# Patient Record
Sex: Male | Born: 1995 | Race: White | Hispanic: No | Marital: Married | Smoking: Never smoker
Health system: Southern US, Community
[De-identification: ages and names within clinical notes are randomized; demographics above are authoritative.]

---
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
## 2015-04-15 ENCOUNTER — Encounter: Payer: Self-pay | Admitting: Sports Medicine

## 2015-04-15 ENCOUNTER — Ambulatory Visit (INDEPENDENT_AMBULATORY_CARE_PROVIDER_SITE_OTHER): Payer: 59 | Admitting: Sports Medicine

## 2015-04-15 DIAGNOSIS — M5416 Radiculopathy, lumbar region: Secondary | ICD-10-CM

## 2015-04-15 MED ORDER — PREGABALIN 50 MG PO CAPS: 50.0000 mg | ORAL_CAPSULE | Freq: Three times a day (TID) | ORAL | Status: AC

## 2015-04-15 NOTE — Assessment & Plan Note (Signed)
Persistent pain, with insufficient response after S1 selective nerve root blocks. Repeat left S1 epidural. Referral to Dr. Yevette Edwardsumonski for consideration of microdiscectomy. Adding Lyrica 50 mg 3 times a day.

## 2015-04-15 NOTE — Progress Notes (Signed)
  Subjective:    CC: Follow-up  HPI: Jordan Espinoza has a fairly large left-sided L5-S1 disc protrusion with material appearing to track down the left S1 nerve root, he did well with physical therapy and subsequently a selective left-sided S1 epidural, on further questioning he tells me he never had complete release of symptoms, and continues to have some mild discogenic axial back pain, his pain however is predominantly in the buttock, posterior thigh, and radicular down the left leg in an exact S1 distribution. He denies any bowel or bladder dysfunction or saddle numbness, no constitutional symptoms. He is looking for a more permanent solution at this time.  Past medical history, Surgical history, Family history not pertinant except as noted below, Social history, Allergies, and medications have been entered into the medical record, reviewed, and no changes needed.   Review of Systems: No fevers, chills, night sweats, weight loss, chest pain, or shortness of breath.   Objective:    General: Well Developed, well nourished, and in no acute distress.  Neuro: Alert and oriented x3, extra-ocular muscles intact, sensation grossly intact.  HEENT: Normocephalic, atraumatic, pupils equal round reactive to light, neck supple, no masses, no lymphadenopathy, thyroid nonpalpable.  Skin: Warm and dry, no rashes. Cardiac: Regular rate and rhythm, no murmurs rubs or gallops, no lower extremity edema.  Respiratory: Clear to auscultation bilaterally. Not using accessory muscles, speaking in full sentences.  Impression and Recommendations:

## 2015-04-23 ENCOUNTER — Ambulatory Visit
Admission: RE | Admit: 2015-04-23 | Discharge: 2015-04-23 | Disposition: A | Discharge status: Home / Self Care | Payer: Self-pay | Source: Ambulatory Visit | Attending: Sports Medicine | Admitting: Sports Medicine

## 2015-04-23 VITALS — BP 128/80 | HR 70

## 2015-04-23 DIAGNOSIS — M5416 Radiculopathy, lumbar region: Secondary | ICD-10-CM

## 2015-04-23 MED ORDER — METHYLPREDNISOLONE ACETATE 40 MG/ML INJ SUSP (RADIOLOG
120.0000 mg | Freq: Once | INTRAMUSCULAR | Status: AC
  Administered 2015-04-23: 120 mg via EPIDURAL

## 2015-04-23 MED ORDER — IOHEXOL 180 MG/ML  SOLN
1.0000 mL | Freq: Once | INTRAMUSCULAR | Status: AC | PRN
  Administered 2015-04-23: 1 mL via EPIDURAL

## 2015-04-23 NOTE — Discharge Instructions (Signed)

## 2015-10-01 IMAGING — CT CT NECK W/ CM
3 of 5 series · 13 of 33 positions shown, 16 images · IV contrast (omnipaque)
Comparison: None available.

CLINICAL DATA: Lymph adenitis. Palpable nodule on the left side of
the neck which has been increasing in size despite antibiotics.

EXAM:
CT NECK WITH CONTRAST
TECHNIQUE: Multidetector CT imaging of the neck was performed using the
standard protocol following the bolus administration of intravenous
contrast.
CONTRAST:  75mL OMNIPAQUE IOHEXOL 300 MG/ML  SOLN

[Series 4: neck 2.0 coronal · coronal · 0.56mm/px · 3 of 107 slices shown]
[im 31/107  bone]
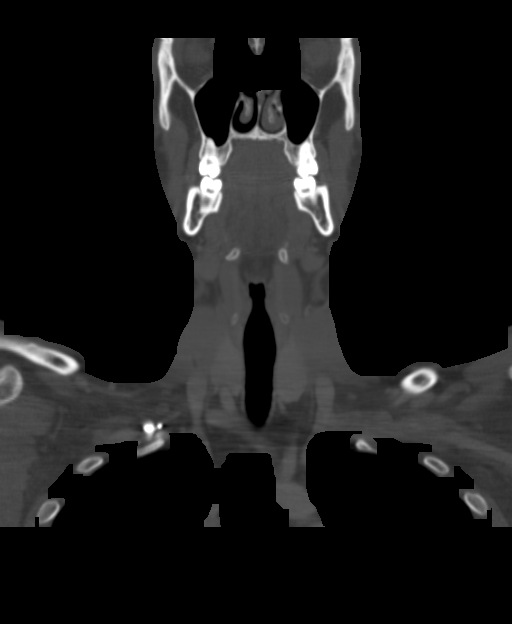
[im 46/107  bone]
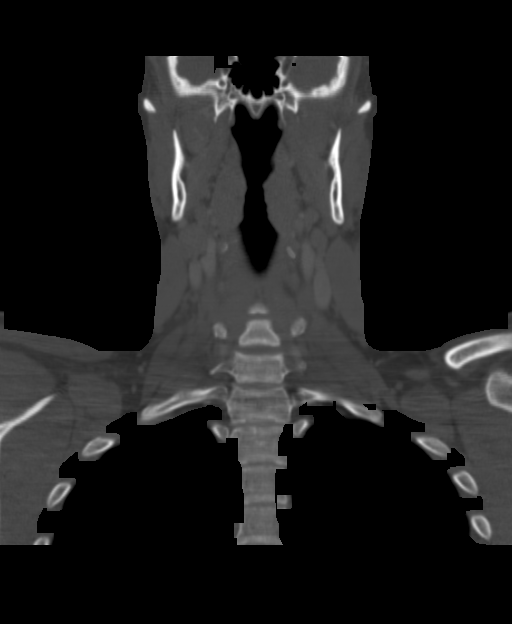
[im 61/107  bone]
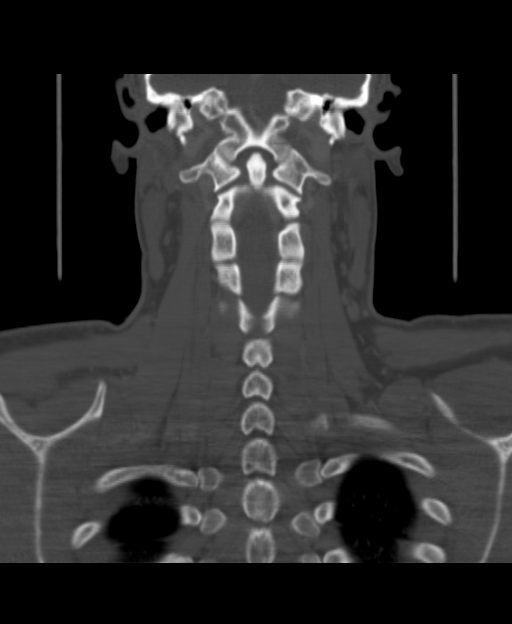

[Series 5: neck 2.0 sagittal · sagittal · 0.56mm/px · 5 of 128 slices shown, 6 images]
[im 43/128  bone]
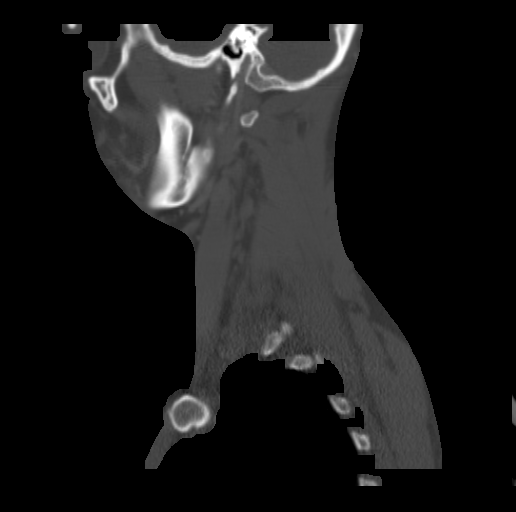
[im 53/128  bone]
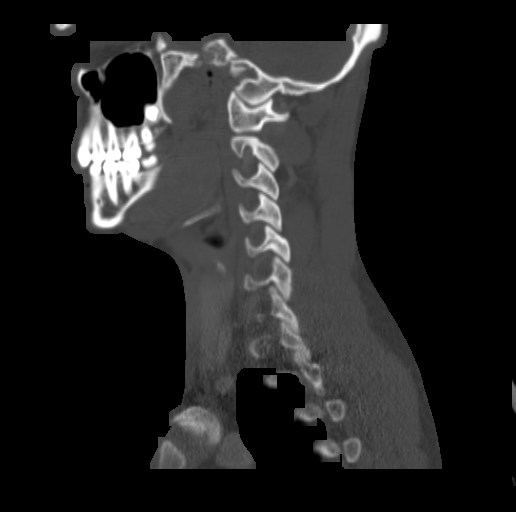
[im 64/128  soft-tissue]
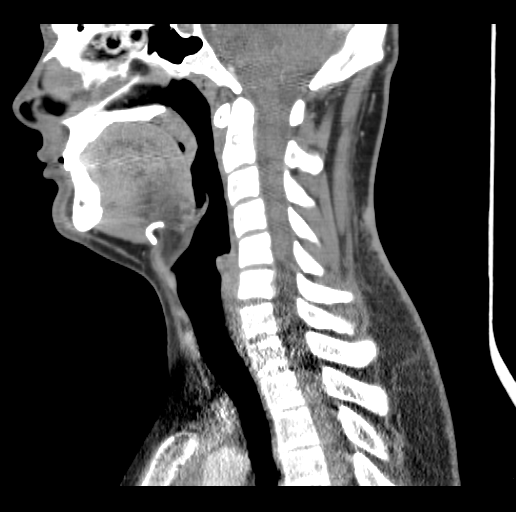
[im 64/128  bone]
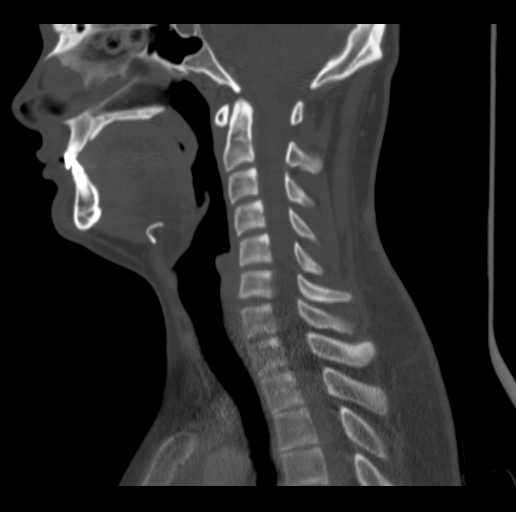
[im 75/128  bone]
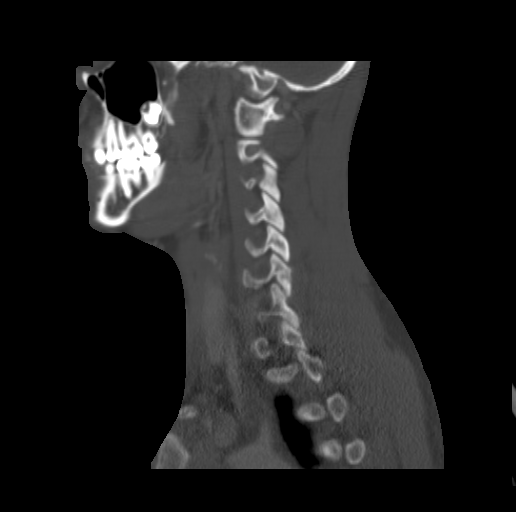
[im 85/128  bone]
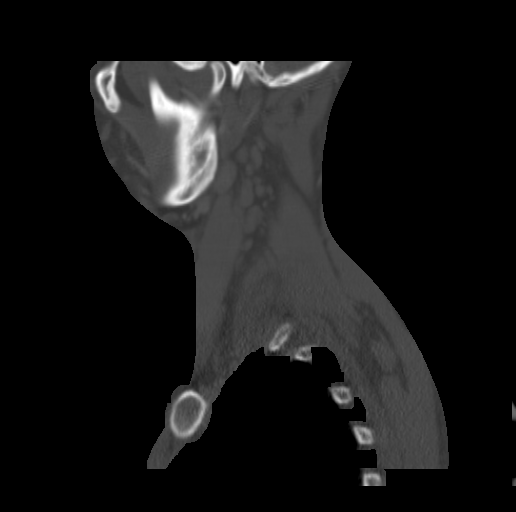

[Series 7: neck 2.0 orth axial to hyoid · axial · 0.38mm/px · z∈[+1013,+1205]mm · 5 of 148 slices shown, 7 images]
[im 25/148  soft-tissue]
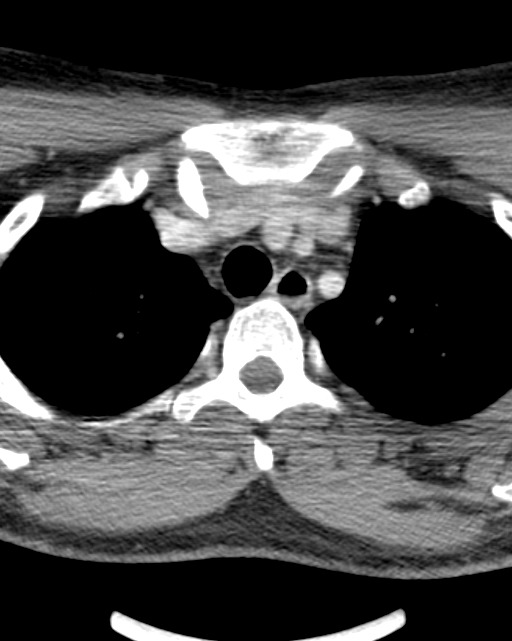
[im 25/148  bone]
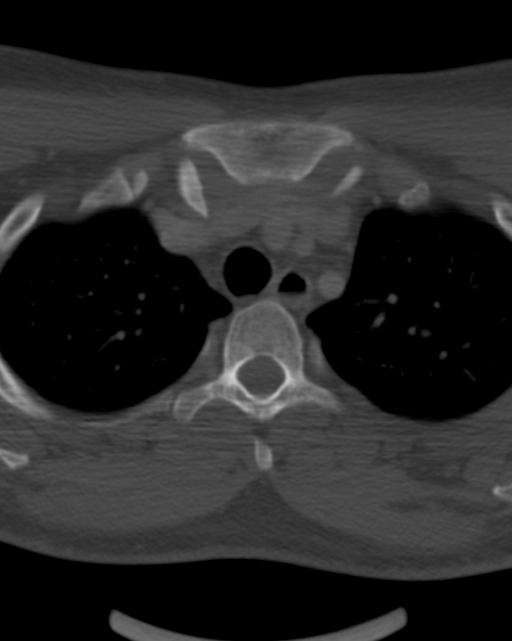
[im 50/148  bone]
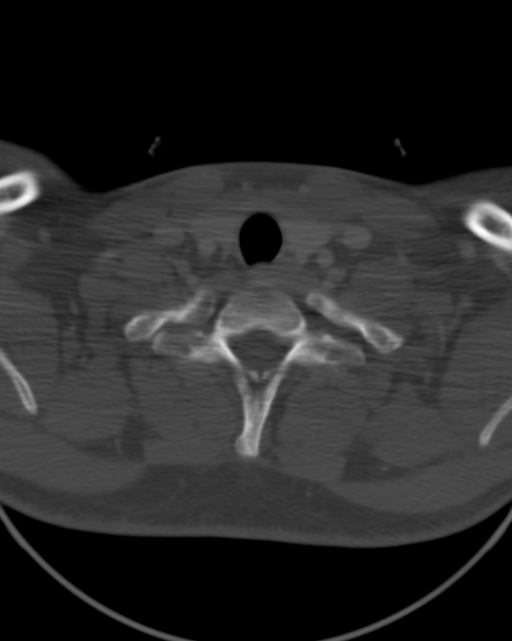
[im 74/148  bone]
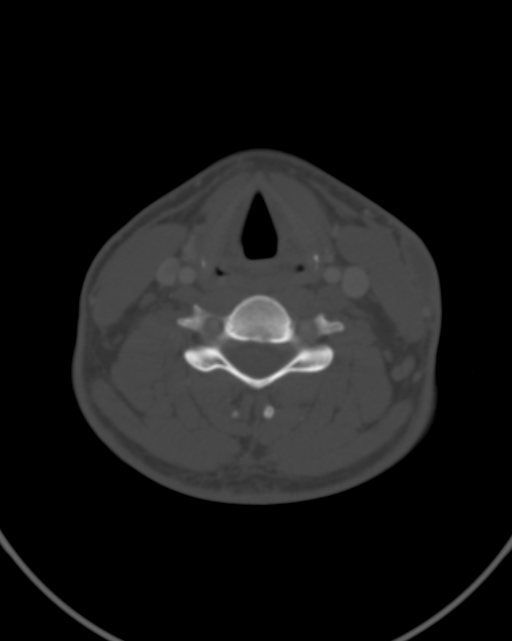
[im 99/148  bone]
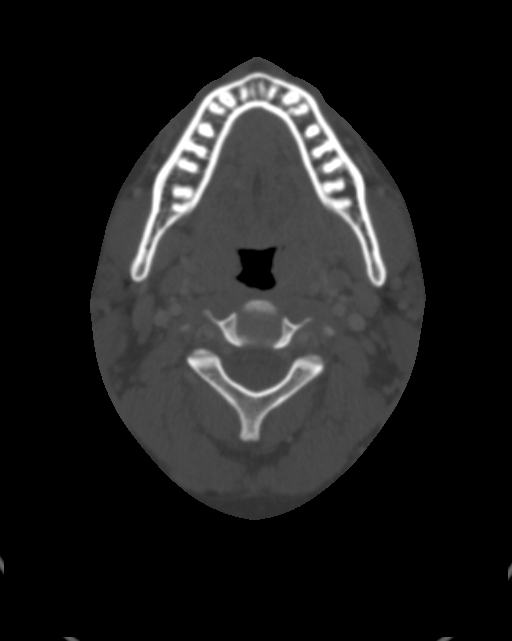
[im 123/148  soft-tissue]
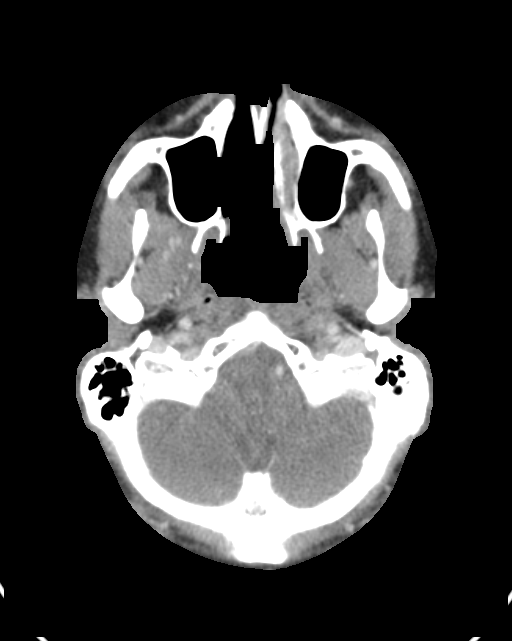
[im 123/148  bone]
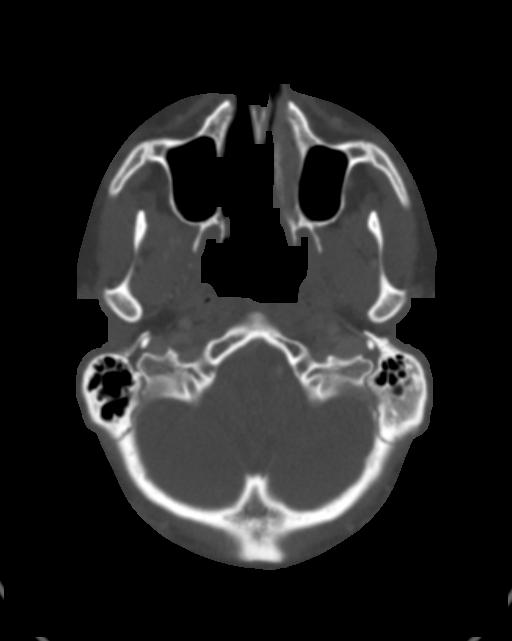

[13 of 33 positions shown; findings below may reference images not displayed]

FINDINGS: A peripherally enhancing subcutaneous fluid collection is noted
adjacent to the left sternocleidomastoid muscle. The collection
measures 9 x 29 x 28 mm. There appears to be a fat plane between a
collection an these sternocleidomastoid muscle. No other focal
cutaneous or subcutaneous lesions are evident.

Limited imaging of the brain is unremarkable. The tongue base is
normal. No focal mucosal or submucosal lesions are present in the
suprahyoid neck. The epiglottis and valleculae are clear.

The larynx is unremarkable. Vocal cords are midline and symmetric.
The cartilages are within normal limits.

The trachea and esophagus are normal. The superior mediastinum is
within normal limits. The lung apices are clear.

Reactive type level 2 lymph nodes are present bilaterally. There is
no pathologic adenopathy. Bone windows are unremarkable.
IMPRESSION: 1. 9 x 29 x 28 mm peripherally enhancing subcutaneous collection
adjacent to the left sternocleidomastoid muscle. The primary
differential diagnosis includes focal abscess associated with
cellulitis or a scan at neoplasm.
2. Reactive type level 2 lymph nodes bilaterally without evidence
for metastatic disease.

## 2016-08-27 ENCOUNTER — Encounter: Payer: Self-pay | Admitting: Sports Medicine
